# Patient Record
Sex: Male | Born: 1935 | Race: White | Hispanic: No
Health system: Midwestern US, Academic
[De-identification: ages and names within clinical notes are randomized; demographics above are authoritative.]

---

## 2016-09-21 ENCOUNTER — Ambulatory Visit: Admit: 2016-09-21 | Discharge: 2016-09-22 | Payer: MEDICARE

## 2016-09-22 DIAGNOSIS — Z950 Presence of cardiac pacemaker: ICD-10-CM

## 2016-09-22 DIAGNOSIS — I495 Sick sinus syndrome: Principal | ICD-10-CM

## 2016-10-17 ENCOUNTER — Encounter: Admit: 2016-10-17 | Discharge: 2016-10-17 | Payer: MEDICARE

## 2016-12-13 LAB — CBC
Lab: 4 — ABNORMAL LOW (ref 4.70–6.10)
Lab: 9.7

## 2016-12-13 LAB — COMPREHENSIVE METABOLIC PANEL
Lab: 0.2
Lab: 0.7
Lab: 104
Lab: 112 — ABNORMAL HIGH (ref 83–110)
Lab: 139 — ABNORMAL LOW (ref 14.0–18.0)
Lab: 16 — ABNORMAL LOW (ref 33.0–37.0)
Lab: 173 — ABNORMAL HIGH (ref 40–150)
Lab: 2.9 — ABNORMAL LOW (ref 3.4–4.8)
Lab: 25 — ABNORMAL HIGH (ref 27.0–31.0)
Lab: 4.3 — ABNORMAL LOW (ref 42.0–52.0)
Lab: 5.8 — ABNORMAL LOW (ref 6.2–8.1)
Lab: 8.6 — ABNORMAL LOW (ref 8.8–10.0)

## 2016-12-22 ENCOUNTER — Ambulatory Visit: Admit: 2016-12-21 | Discharge: 2016-12-22 | Payer: MEDICARE

## 2016-12-22 DIAGNOSIS — Z950 Presence of cardiac pacemaker: ICD-10-CM

## 2016-12-22 DIAGNOSIS — I495 Sick sinus syndrome: Principal | ICD-10-CM

## 2017-01-08 ENCOUNTER — Encounter: Admit: 2017-01-08 | Discharge: 2017-01-08 | Payer: MEDICARE

## 2017-01-22 ENCOUNTER — Ambulatory Visit: Admit: 2017-01-22 | Discharge: 2017-01-23 | Payer: MEDICARE

## 2017-01-22 DIAGNOSIS — Z950 Presence of cardiac pacemaker: ICD-10-CM

## 2017-01-22 DIAGNOSIS — I480 Paroxysmal atrial fibrillation: Principal | ICD-10-CM

## 2017-01-22 DIAGNOSIS — I25118 Atherosclerotic heart disease of native coronary artery with other forms of angina pectoris: ICD-10-CM

## 2017-01-22 DIAGNOSIS — I255 Ischemic cardiomyopathy: Principal | ICD-10-CM

## 2017-01-22 DIAGNOSIS — I10 Essential (primary) hypertension: ICD-10-CM

## 2017-01-22 NOTE — Progress Notes
Date of Service: 01/22/2017    Billy Calderon is a 83 y.o. male.       HPI     Herb was in the [place] office today with his son.  He is living in a skilled nursing facility now and seems to be getting along pretty well.  He is pretty much confined to a wheelchair now he needs help with transfers.  He has had no falls.  He denies any problems with exertional chest discomfort or lightheadedness.  He has breathlessness with pretty much any minor exertion.         Vitals:    01/22/17 1340   BP: 142/82   Pulse: 77   Weight: 78.5 kg (173 lb)   Height: 1.702 m (5' 7)     Body mass index is 27.1 kg/m???.     Past Medical History  Patient Active Problem List    Diagnosis Date Noted   ??? PAF (paroxysmal atrial fibrillation) (HCC) 05/25/2015   ??? Shortness of breath on exertion 02/22/2009   ??? Chest pain at rest 02/22/2009     Left chest pain lasted 45 mins, 1 episode back in Oct     ??? Pacemaker 02/22/2009   ??? Coronary artery disease 12/14/2008      (primary symptom was left shoulder/arm discomfort)     10/00 - Inferior MI treated with t-PA at [facility] and transferred to [facility];                Emergent PTCA/stent to RCA.     10/00 - CABG x 5 done at [facility] by Dr. Nancee Liter, SVG-RCA,                SVG-Diag, SSVG-first Diag and OMB.     12/00 - Exercise echo:5 min 30 sec, 80% of targeted HR, EF 50-55%;Negative for                ischemia with evidence of an inferobasal MI.      2/06 - Stress MPI: EF 52%, no active inducible ischemia.      03/2011 - Dobutamine echocardiogram [facility], Dr. Gypsy Decant):  Fixed apical hypokinesis.  No EF reported.     ??? Vertebral artery stenosis 12/14/2008     4/96 - Carotid doppler:severe arteriosclerotic stenosis of left vertebral artery; normal                appearance of common carotid artery and bifurcation bilaterally.     ??? Hyperlipidemia 12/14/2008     3/01 - Total 214, trig 113, HDL 61, LDL 130-No meds;Baycol 0.4mg  initiated. 5/01 - Total 149, trig 125, HDL 45, LDL 79, AST 21, ALT 37-Baycol 0.4mg .        8/01 - Baycol recalled;Zocor 20mg  initiated.        10/01 - Zocor d/c'd due to aching in legs.        11/01 - Total 201, trig 114, HDL 45, LDL 133, AST 21, ALT 39-No meds.        12/01 - Pravachol 20mg  initiated.        1/02 - Total 177, trig 102, HDL 48, LDL 109, AST 20, ALT 39-Pravachol 20mg .        10/02 - Pravachol DC'ed due to myalgias and heartburn.     ??? Seizure disorder (HCC) 12/14/2008   ??? Arthritis/Arthralgia 12/14/2008   ??? History of Gastritis with GI Bleed  12/14/2008   ??? Dizziness 12/14/2008   ??? Bradycardia 07/11/2007   ???  Hypertension 07/11/2007   ??? Ischemic cardiomyopathy 07/11/2007     10/00 - EF 40%.     12/00 - EF 55% per echo.     ??? 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 Acute MI inferior wall 01/03/1999     S/P PTCA, STENT RCA           Review of Systems   Constitution: Positive for weakness and malaise/fatigue.   HENT: Positive for hearing loss.    Eyes: Positive for blurred vision and vision loss in left eye.   Cardiovascular: Negative.    Respiratory: Positive for snoring.    Endocrine: Positive for cold intolerance and polyuria.   Hematologic/Lymphatic: Negative.    Skin: Negative.    Musculoskeletal: Positive for arthritis, back pain and stiffness.   Gastrointestinal: Positive for bowel incontinence, constipation and diarrhea.   Genitourinary: Positive for bladder incontinence and decreased libido.   Neurological: Positive for excessive daytime sleepiness, dizziness and focal weakness.   Psychiatric/Behavioral: Positive for depression.   Allergic/Immunologic: Negative.        Physical Exam    Physical Exam   General Appearance: no distress   Skin: warm, no ulcers or xanthomas   Digits and Nails: no cyanosis or clubbing   Eyes: conjunctivae and lids normal, pupils are equal and round   Teeth/Gums/Palate: dentition unremarkable, no lesions Lips & Oral Mucosa: no pallor or cyanosis   Neck Veins: normal JVP , neck veins are not distended   Thyroid: no nodules, masses, tenderness or enlargement   Chest Inspection: chest is normal in appearance   Respiratory Effort: breathing comfortably, no respiratory distress   Auscultation/Percussion: lungs clear to auscultation, no rales or rhonchi, no wheezing   PMI: PMI not enlarged or displaced   Cardiac Rhythm: regular rhythm and normal rate   Cardiac Auscultation: S1, S2 normal, no rub, no gallop   Murmurs: no murmur   Peripheral Circulation: normal peripheral circulation   Carotid Arteries: normal carotid upstroke bilaterally, no bruits   Radial Arteries: normal symmetric radial pulses   Abdominal Aorta: no abdominal aortic bruit   Pedal Pulses: normal symmetric pedal pulses   Lower Extremity Edema: no lower extremity edema   Abdominal Exam: soft, non-tender, no masses, bowel sounds normal   Liver & Spleen: no organomegaly   Gait & Station: in a wheelchair  Muscle Strength: normal muscle tone   Orientation: oriented to time, place and person   Affect & Mood: appropriate and sustained affect   Language and Memory: patient responsive and seems to comprehend information   Neurologic Exam: neurological assessment grossly intact   Other: moves all extremities      Problems Addressed Today  Encounter Diagnoses   Name Primary?   ??? Ischemic cardiomyopathy Yes   ??? PAF (paroxysmal atrial fibrillation) (HCC)    ??? Pacemaker    ??? Coronary artery disease of native artery of native heart with stable angina pectoris (HCC)    ??? Essential hypertension        Assessment and Plan       PAF (paroxysmal atrial fibrillation) (HCC)  No recurrent atrial arrhythmias on recent device check.    Coronary artery disease  No angina or other symptoms to suggest progression of coronary disease.    Hypertension  Blood pressure seems fine on the current medical program.    Pacemaker  Normal pacing system function today. Current Medications (  including today's revisions)  ??? acetaminophen (TYLENOL) 325 mg tablet Take 325 mg by mouth every 4-6 hours as needed for Pain.   ??? aspirin 325 mg tablet Take 325 mg by mouth daily. Take with food.   ??? EX-LAX (SENNOSIDES) PO Take  by mouth as Needed.   ??? IBUPROFEN (ADVIL PO) Take 200 mg by mouth As Needed.   ??? magnesium citrate oral solution Take 296 mL by mouth as Needed.   ??? Menthol-Zinc Oxide (CALMOSEPTINE) 0.44-20.6 % oint Apply  topically to affected area as Needed.   ??? Multivitamins with Iron (TAB-A-VITE/IRON) tab Take 1 Tab by mouth daily.   ??? nitroglycerin (NITROSTAT) 0.4 mg tablet 1 Tab every 5 minutes as needed for Chest Pain.   ??? nystatin (MYCOSTATIN) 100,000 unit/g topical cream Apply  topically to affected area three times daily.   ??? OMEGA-3 FATTY ACIDS (OMEGA 3 PO) Take 1 Tab by mouth Twice Daily.   ??? omeprazole DR(+) (PRILOSEC) 20 mg capsule Take 1 Cap by mouth daily. (Patient taking differently: Take 20 mg by mouth every 48 hours.)   ??? phenytoin SR (DILANTIN) 100 mg capsule Take 100 mg by mouth twice daily.   ??? polyethylene glycol 3350 (GLYCOLAX; MIRALAX) 17 gram/dose powder Take 17 g by mouth daily.   ??? SENNOSIDES/DOCUSATE SODIUM (SENNA PLUS PO) Take  by mouth twice daily.   ??? sertraline (ZOLOFT) 25 mg tablet Take 25 mg by mouth daily.   ??? simvastatin (ZOCOR) 10 mg tablet Take 1 tablet by mouth at bedtime daily.

## 2017-01-25 ENCOUNTER — Encounter: Admit: 2017-01-25 | Discharge: 2017-01-25 | Payer: MEDICARE

## 2017-02-21 NOTE — Assessment & Plan Note
No recurrent atrial arrhythmias on recent device check.

## 2017-02-21 NOTE — Assessment & Plan Note
Blood pressure seems fine on the current medical program.

## 2017-02-21 NOTE — Assessment & Plan Note
No angina or other symptoms to suggest progression of coronary disease.

## 2017-02-21 NOTE — Assessment & Plan Note
Normal pacing system function today.

## 2017-02-26 ENCOUNTER — Encounter: Admit: 2017-02-26 | Discharge: 2017-02-26 | Payer: MEDICARE

## 2017-02-26 ENCOUNTER — Ambulatory Visit: Admit: 2017-02-26 | Discharge: 2017-02-27 | Payer: MEDICARE

## 2017-02-26 DIAGNOSIS — R42 Dizziness and giddiness: Principal | ICD-10-CM

## 2017-02-28 LAB — LIPID PROFILE
Lab: 105 — ABNORMAL HIGH (ref <100)
Lab: 176
Lab: 22
Lab: 4
Lab: 46

## 2017-02-28 LAB — MAGNESIUM: Lab: 2.4 — ABNORMAL LOW (ref 98–107)

## 2017-03-22 ENCOUNTER — Ambulatory Visit: Admit: 2017-03-22 | Discharge: 2017-03-23 | Payer: MEDICARE

## 2017-03-22 DIAGNOSIS — I495 Sick sinus syndrome: Principal | ICD-10-CM

## 2017-03-23 DIAGNOSIS — Z950 Presence of cardiac pacemaker: ICD-10-CM

## 2017-03-25 ENCOUNTER — Encounter: Admit: 2017-03-25 | Discharge: 2017-03-25 | Payer: MEDICARE

## 2017-05-26 LAB — CBC: Lab: 9.7

## 2017-05-26 LAB — COMPREHENSIVE METABOLIC PANEL
Lab: 0.3
Lab: 134 — ABNORMAL LOW (ref 136–145)
Lab: 14
Lab: 16 — ABNORMAL HIGH (ref 0–14)
Lab: 21
Lab: 216 — ABNORMAL HIGH (ref 40–150)
Lab: 3.4
Lab: 7.7
Lab: 93

## 2017-06-21 ENCOUNTER — Encounter: Admit: 2017-06-21 | Discharge: 2017-06-21 | Payer: MEDICARE

## 2017-06-21 ENCOUNTER — Ambulatory Visit: Admit: 2017-06-21 | Discharge: 2017-06-22 | Payer: MEDICARE

## 2017-06-22 DIAGNOSIS — I495 Sick sinus syndrome: Principal | ICD-10-CM

## 2017-06-22 DIAGNOSIS — Z950 Presence of cardiac pacemaker: ICD-10-CM

## 2017-08-20 ENCOUNTER — Encounter: Admit: 2017-08-20 | Discharge: 2017-08-20 | Payer: MEDICARE

## 2017-08-27 ENCOUNTER — Ambulatory Visit: Admit: 2017-08-27 | Discharge: 2017-08-28 | Payer: MEDICARE

## 2017-08-27 ENCOUNTER — Encounter: Admit: 2017-08-27 | Discharge: 2017-08-27 | Payer: MEDICARE

## 2017-08-27 DIAGNOSIS — I480 Paroxysmal atrial fibrillation: ICD-10-CM

## 2017-08-27 DIAGNOSIS — E785 Hyperlipidemia, unspecified: ICD-10-CM

## 2017-08-27 DIAGNOSIS — I2510 Atherosclerotic heart disease of native coronary artery without angina pectoris: ICD-10-CM

## 2017-08-27 DIAGNOSIS — G40909 Epilepsy, unspecified, not intractable, without status epilepticus: ICD-10-CM

## 2017-08-27 DIAGNOSIS — Z950 Presence of cardiac pacemaker: Principal | ICD-10-CM

## 2017-08-27 DIAGNOSIS — K2970 Gastritis, unspecified, without bleeding: ICD-10-CM

## 2017-08-27 DIAGNOSIS — R079 Chest pain, unspecified: ICD-10-CM

## 2017-08-27 DIAGNOSIS — I25118 Atherosclerotic heart disease of native coronary artery with other forms of angina pectoris: ICD-10-CM

## 2017-08-27 DIAGNOSIS — M1990 Unspecified osteoarthritis, unspecified site: ICD-10-CM

## 2017-08-27 DIAGNOSIS — R0602 Shortness of breath: ICD-10-CM

## 2017-08-27 DIAGNOSIS — I10 Essential (primary) hypertension: ICD-10-CM

## 2017-08-27 DIAGNOSIS — I6509 Occlusion and stenosis of unspecified vertebral artery: ICD-10-CM

## 2017-08-29 ENCOUNTER — Encounter: Admit: 2017-08-29 | Discharge: 2017-08-29 | Payer: MEDICARE

## 2017-09-24 ENCOUNTER — Encounter: Admit: 2017-09-24 | Discharge: 2017-09-24 | Payer: MEDICARE

## 2017-09-30 ENCOUNTER — Ambulatory Visit: Admit: 2017-09-30 | Discharge: 2017-10-01 | Payer: MEDICARE

## 2017-09-30 DIAGNOSIS — Z950 Presence of cardiac pacemaker: Principal | ICD-10-CM

## 2017-10-04 LAB — COMPREHENSIVE METABOLIC PANEL
Lab: 0.4
Lab: 0.8
Lab: 104
Lab: 13
Lab: 139
Lab: 14
Lab: 14
Lab: 145 — ABNORMAL HIGH (ref 83–110)
Lab: 17
Lab: 177 — ABNORMAL HIGH (ref 40–150)
Lab: 26
Lab: 3.3 — ABNORMAL LOW (ref 3.4–4.8)
Lab: 3.9
Lab: 86
Lab: 9.1

## 2017-10-08 ENCOUNTER — Encounter: Admit: 2017-10-08 | Discharge: 2017-10-08 | Payer: MEDICARE

## 2017-11-19 ENCOUNTER — Ambulatory Visit: Admit: 2017-11-19 | Discharge: 2017-11-20 | Payer: MEDICARE

## 2017-11-19 DIAGNOSIS — I480 Paroxysmal atrial fibrillation: ICD-10-CM

## 2017-11-19 DIAGNOSIS — Z950 Presence of cardiac pacemaker: ICD-10-CM

## 2017-11-19 DIAGNOSIS — I255 Ischemic cardiomyopathy: Principal | ICD-10-CM

## 2017-11-28 LAB — BASIC METABOLIC PANEL
Lab: 0.7
Lab: 104
Lab: 104 — ABNORMAL LOW (ref 42.0–52.0)
Lab: 109
Lab: 139 — ABNORMAL LOW (ref 4.70–6.10)
Lab: 15 — ABNORMAL HIGH (ref 0–14)
Lab: 24 — ABNORMAL HIGH (ref 80.0–99.0)
Lab: 4.1 — ABNORMAL LOW (ref 14.0–18.0)
Lab: 8.5 — ABNORMAL LOW (ref 8.8–10.0)

## 2017-11-28 LAB — CBC: Lab: 7.2

## 2017-12-30 ENCOUNTER — Encounter: Admit: 2017-12-30 | Discharge: 2017-12-30 | Payer: MEDICARE

## 2017-12-30 ENCOUNTER — Ambulatory Visit: Admit: 2017-12-30 | Discharge: 2017-12-31 | Payer: MEDICARE

## 2017-12-31 DIAGNOSIS — Z950 Presence of cardiac pacemaker: Principal | ICD-10-CM

## 2018-01-09 ENCOUNTER — Encounter: Admit: 2018-01-09 | Discharge: 2018-01-09 | Payer: MEDICARE

## 2018-01-28 ENCOUNTER — Encounter: Admit: 2018-01-28 | Discharge: 2018-01-28 | Payer: MEDICARE

## 2018-02-13 ENCOUNTER — Ambulatory Visit: Admit: 2018-02-13 | Discharge: 2018-02-14 | Payer: MEDICARE

## 2018-02-13 ENCOUNTER — Encounter: Admit: 2018-02-13 | Discharge: 2018-02-13 | Payer: MEDICARE

## 2018-02-13 DIAGNOSIS — Z950 Presence of cardiac pacemaker: Principal | ICD-10-CM

## 2018-02-13 DIAGNOSIS — E785 Hyperlipidemia, unspecified: ICD-10-CM

## 2018-02-13 DIAGNOSIS — I480 Paroxysmal atrial fibrillation: ICD-10-CM

## 2018-02-13 DIAGNOSIS — I2510 Atherosclerotic heart disease of native coronary artery without angina pectoris: ICD-10-CM

## 2018-02-13 DIAGNOSIS — M1990 Unspecified osteoarthritis, unspecified site: ICD-10-CM

## 2018-02-13 DIAGNOSIS — R0602 Shortness of breath: ICD-10-CM

## 2018-02-13 DIAGNOSIS — Z951 Presence of aortocoronary bypass graft: ICD-10-CM

## 2018-02-13 DIAGNOSIS — G40909 Epilepsy, unspecified, not intractable, without status epilepticus: ICD-10-CM

## 2018-02-13 DIAGNOSIS — I10 Essential (primary) hypertension: ICD-10-CM

## 2018-02-13 DIAGNOSIS — R079 Chest pain, unspecified: ICD-10-CM

## 2018-02-13 DIAGNOSIS — K2970 Gastritis, unspecified, without bleeding: ICD-10-CM

## 2018-02-13 DIAGNOSIS — I6509 Occlusion and stenosis of unspecified vertebral artery: ICD-10-CM

## 2018-04-01 ENCOUNTER — Ambulatory Visit: Admit: 2018-04-01 | Discharge: 2018-04-02 | Payer: MEDICARE

## 2018-04-01 ENCOUNTER — Encounter: Admit: 2018-04-01 | Discharge: 2018-04-01 | Payer: MEDICARE

## 2018-04-02 DIAGNOSIS — Z950 Presence of cardiac pacemaker: Secondary | ICD-10-CM

## 2018-05-19 ENCOUNTER — Encounter: Admit: 2018-05-19 | Discharge: 2018-05-19 | Payer: MEDICARE

## 2018-05-19 MED ORDER — NITROSTAT 0.4 MG SL SUBL
ORAL_TABLET | SUBLINGUAL | 1 refills | 9.00000 days | Status: AC
Start: 2018-05-19 — End: ?

## 2018-05-19 NOTE — Telephone Encounter
Left message requesting callback to discuss use of NTG SL tablets.

## 2018-06-30 ENCOUNTER — Encounter: Admit: 2018-06-30 | Discharge: 2018-06-30 | Payer: MEDICARE

## 2018-06-30 ENCOUNTER — Ambulatory Visit: Admit: 2018-06-30 | Discharge: 2018-06-30 | Payer: MEDICARE

## 2018-06-30 DIAGNOSIS — I495 Sick sinus syndrome: Principal | ICD-10-CM

## 2018-06-30 DIAGNOSIS — Z950 Presence of cardiac pacemaker: ICD-10-CM

## 2018-07-02 ENCOUNTER — Encounter: Admit: 2018-07-02 | Discharge: 2018-07-02 | Payer: MEDICARE

## 2018-08-21 ENCOUNTER — Encounter: Admit: 2018-08-21 | Discharge: 2018-08-21

## 2018-08-21 NOTE — Telephone Encounter
Billy Calderon for an update on patient.  Patient was admitted to Hospice on 6/2 for advanced heart failure.  He is currently living in an assisted living facility.  He is continuing to take his medications.  His last bp was 172/82, O2  95 % on 1.5-2.0 liters per NC.  He has a decreased appetite, weakness, short term memory loss and sleeps about 80% of the time.  Hospice is visiting again today and will plan to reassess his bp.   Will route update to [facility] for review.          Billy, Billy Parody, RN    pt son Billy Calderon called to cancel Billy Calderon's appointment today w/ SDO due to being very weak, recently hospitalized at [facility]. Pt is now under the care of [facility] hospital hospice. pt son would like [place] to call his Calderon and get a report and pass all info onto [facility].     [facility]   Billy Calderon) [phone]       Billy Calderon (son)       Please call Billy Calderon back if anything else is needed

## 2018-09-29 ENCOUNTER — Ambulatory Visit: Admit: 2018-09-29 | Discharge: 2018-09-29

## 2018-09-29 ENCOUNTER — Encounter: Admit: 2018-09-29 | Discharge: 2018-09-29

## 2018-09-29 DIAGNOSIS — Z950 Presence of cardiac pacemaker: Principal | ICD-10-CM

## 2019-01-06 ENCOUNTER — Encounter: Admit: 2019-01-06 | Discharge: 2019-01-06 | Payer: MEDICARE

## 2019-01-06 MED ORDER — DILANTIN 30 MG PO CAP
ORAL_CAPSULE | Freq: Two times a day (BID) | PRN refills
Start: 2019-01-06 — End: ?

## 2019-01-06 NOTE — Telephone Encounter
Patient is not of Dr. Jena Gauss

## 2019-01-13 ENCOUNTER — Encounter: Admit: 2019-01-13 | Discharge: 2019-01-13 | Payer: MEDICARE

## 2019-01-14 MED ORDER — DILANTIN 30 MG PO CAP
ORAL_CAPSULE | Freq: Two times a day (BID) | PRN refills
Start: 2019-01-14 — End: ?

## 2019-01-26 ENCOUNTER — Encounter: Admit: 2019-01-26 | Discharge: 2019-01-26 | Payer: MEDICARE

## 2019-01-26 DIAGNOSIS — I480 Paroxysmal atrial fibrillation: Secondary | ICD-10-CM

## 2019-01-26 DIAGNOSIS — I255 Ischemic cardiomyopathy: Secondary | ICD-10-CM

## 2019-01-26 DIAGNOSIS — Z950 Presence of cardiac pacemaker: Secondary | ICD-10-CM

## 2019-03-13 DEATH — deceased

## 2019-06-08 ENCOUNTER — Encounter: Admit: 2019-06-08 | Discharge: 2019-06-08 | Payer: MEDICARE

## 2019-06-16 ENCOUNTER — Encounter: Admit: 2019-06-16 | Discharge: 2019-06-16 | Payer: MEDICARE

## 2019-09-16 ENCOUNTER — Encounter: Admit: 2019-09-16 | Discharge: 2019-09-16 | Payer: MEDICARE

## 2019-09-16 NOTE — Telephone Encounter
Patient was scheduled for a [employer] on 03/30/19 that has not been received. Obit search shows the patient passed away on March 22, 2019. Deactivated his remote monitoring. DB.

## 2020-07-24 IMAGING — CT BRAIN WO(Adult)
3 of 4 series · 4 of 11 positions shown, 5 images · non-contrast
Comparison: none

[Series 2: brain ax 5.00 hr40 s3 · axial · 0.34mm/px · z∈[-578,-528]mm · 2 of 4 slices shown, 3 images]
[im 2/4  brain]
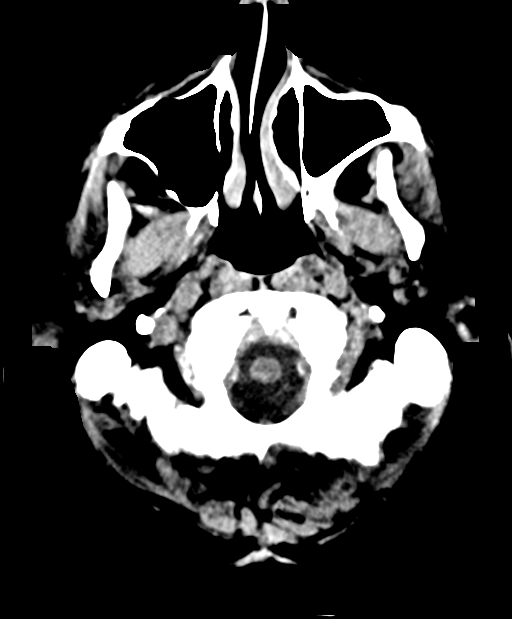
[im 2/4  bone]
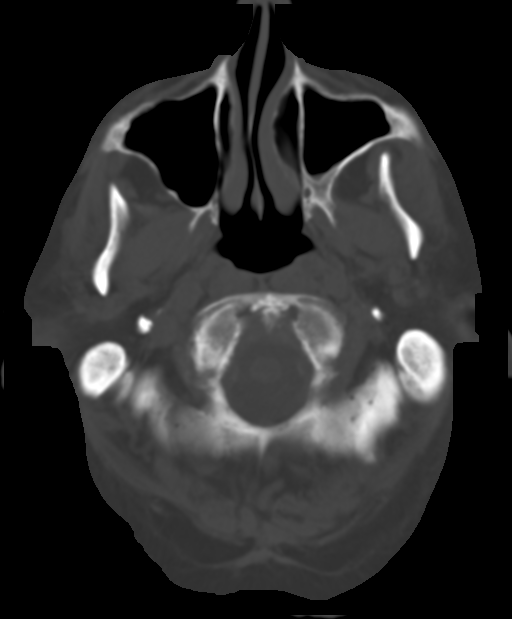
[im 3/4  brain]
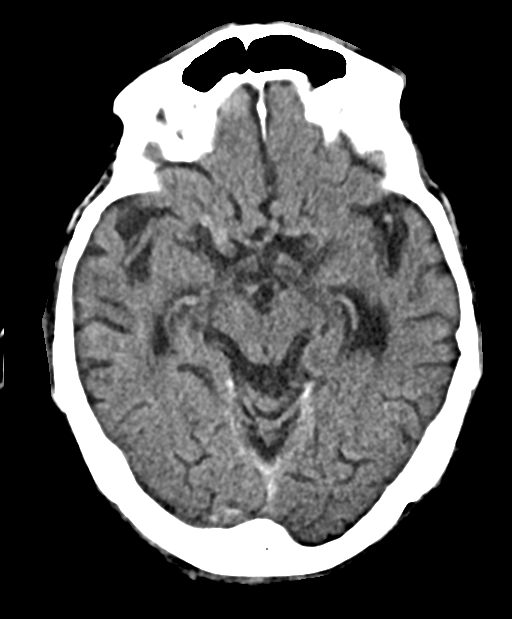

[Series 4: brain cor 5.00 hr40 s3 · coronal · 0.30mm/px · 1 of 3 slices shown]
[im 2/3  brain]
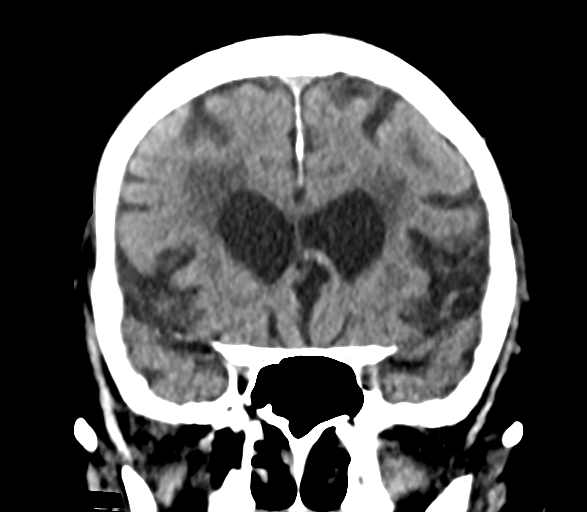

[Series 6: brain sag 5.00 hr40 s3 · sagittal · 0.30mm/px · 1 of 3 slices shown]
[im 2/3  brain]
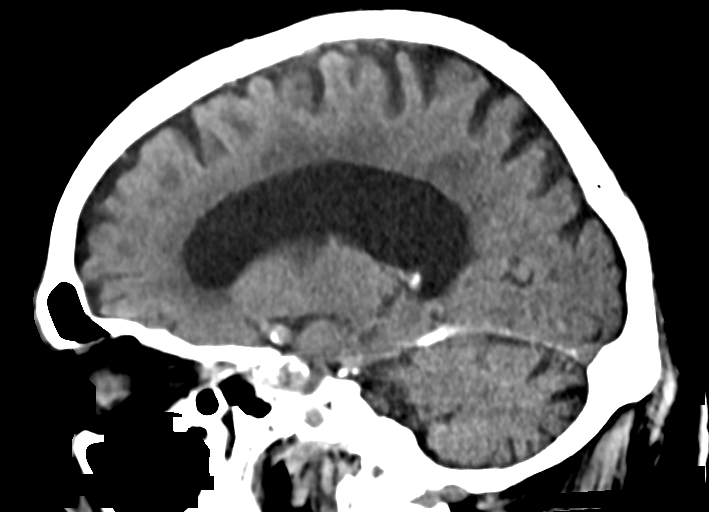

[4 of 11 positions shown; findings below may reference images not displayed]

**ADDENDUM**
ADDENDUM

Number of previous computed tomography exams in the last 12 months is 2.

Number of previous nuclear medicine myocardial perfusion studies in the last 12 months is 0.

TD/TT: /

EXAM

COMPUTED TOMOGRAPHY, HEAD OR BRAIN; WITHOUT CONTRAST MATERIAL CPT 71421

INDICATION

DIZZINESS, SYNCOPAL EPISODE, CONFUSION.

TECHNIQUE

Multiple contiguous transaxial images were obtained through the brain utilizing a multidetector CT
scanner.

All CT scans at this facility use dose modulation, iterative reconstruction, and/or weight based
dosing when appropriate to reduce radiation dose to as low as reasonably achievable.

COMPARISONS

Previous examination dated 05/26/2017.

FINDINGS

There is diffuse cortical atrophy. The ventricles and sulci are accordingly prominent. There is no
mass effect or shift in the midline structures. Lucency throughout the centrum ovale is consistent
with deep white matter ischemic changes and periventricular leukomalacia. The skull base overlying
and overlying calvarium are within normal limits.

IMPRESSION

Cortical atrophy and age related involutional changes. Periventricular leukomalacia. No acute
intracranial hemorrhage.

Tech Notes:

DIZZINESS, SYNCOPAL EPISODE, CONFUSION.

## 2020-07-30 IMAGING — CR CHEST
1 series · 1 of 1 positions shown · non-contrast
Comparison: none

[chest ap grid]
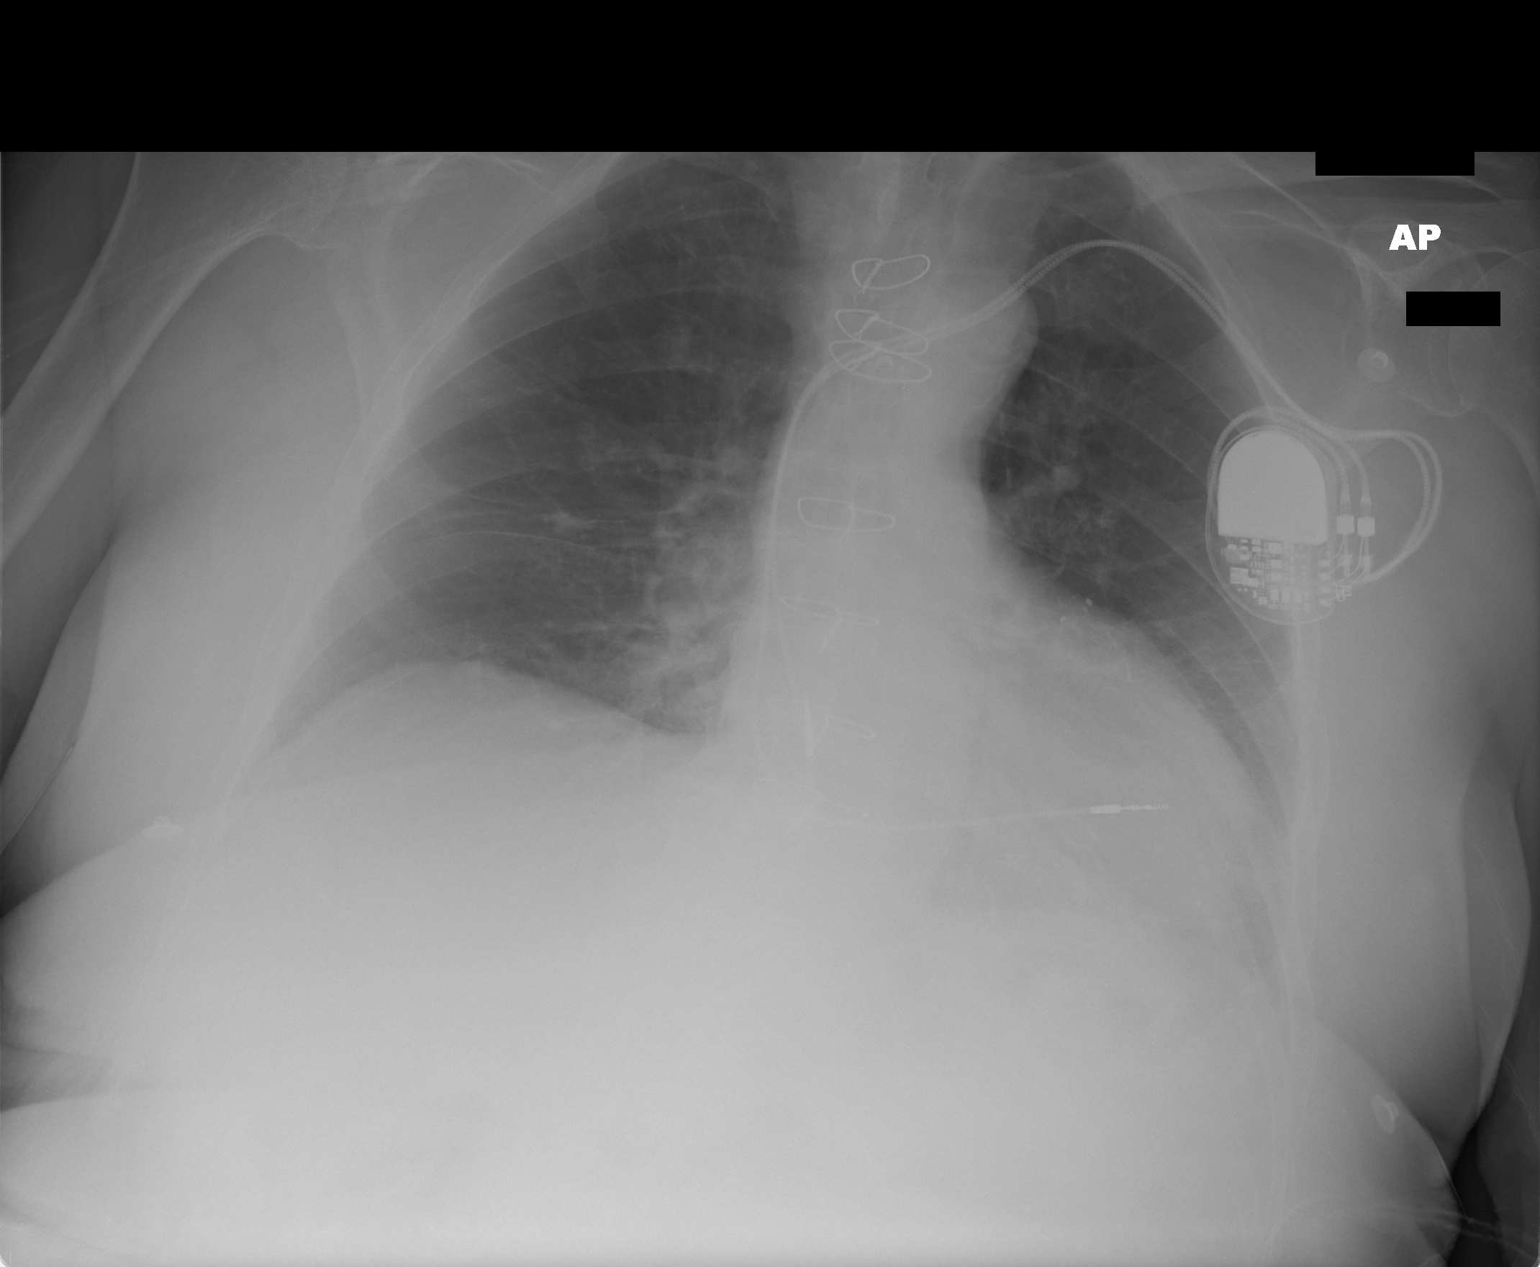

[1 of 1 positions shown; findings below may reference images not displayed]

EXAM
Chest x-ray.

INDICATION
shortness of breath
CONFUSION

FINDINGS
A single view of the chest was obtained. The prior study was reviewed from 05/26/2017.
There is mild cardiomegaly. There is interstitial opacity in the mid to lower portions of both
lungs. There has been partial clearing of a patchy area of alveolar opacity from the right lung base
since a prior study.
Pacemaker and transvenous electrodes are present.
There has been improvement of mild pulmonary vascular congestion.

IMPRESSION
There has been improvement of mild central pulmonary vascular congestion. There has been clearing
of a small area of alveolar opacity right lung base since the prior study.

Tech Notes:

CONFUSION

## 2020-10-04 IMAGING — CR CHEST
1 series · 1 of 1 positions shown · non-contrast
Comparison: none

[chest port x-wise]
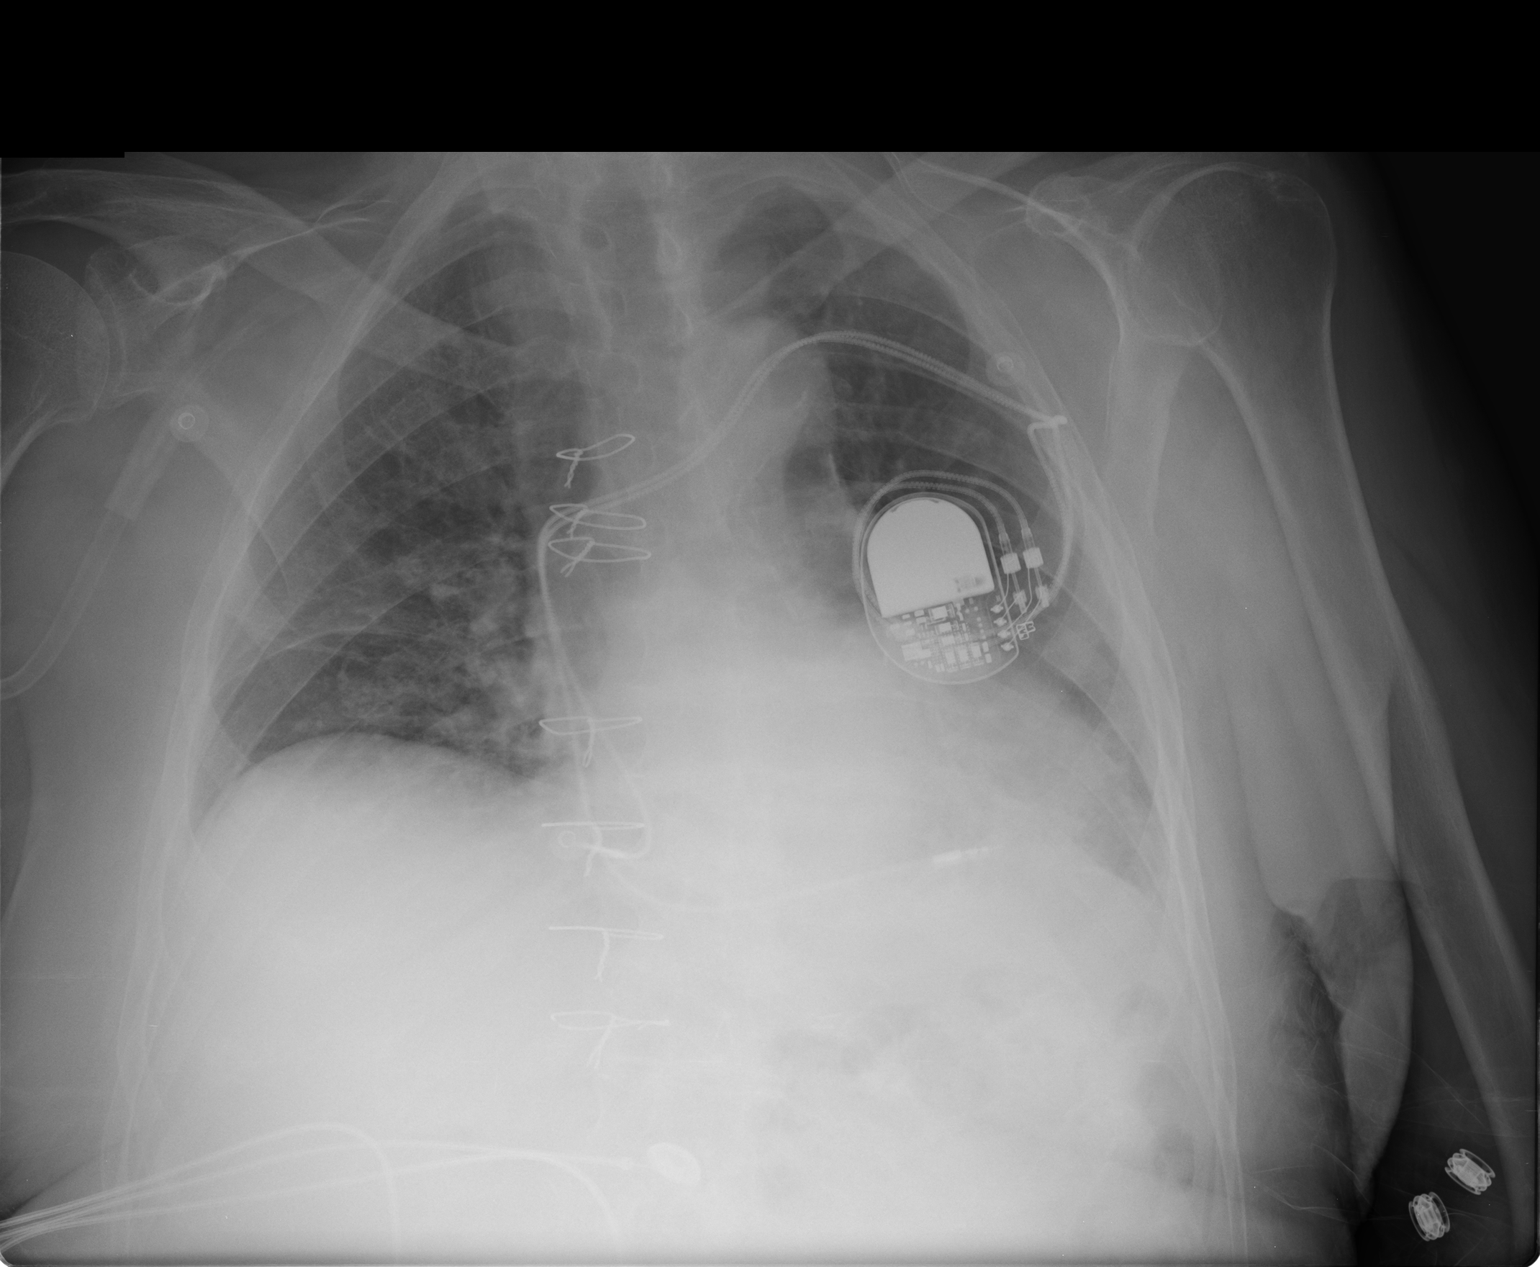

[1 of 1 positions shown; findings below may reference images not displayed]

DIAGNOSTIC STUDIES

EXAM
RADIOLOGICAL EXAMINATION, CHEST; SINGLE VIEW, FRONTAL CPT 65454

INDICATION
decreased LOC
FEVER, DECREASED LOC, DECREASED O2 SATS. KNOWN UTI, ON ABX. H/O A.FIB, CABG X5, CAD, CVA, HTN,
PACEMAKER.

TECHNIQUE
Single AP view chest

COMPARISONS
Chest radiograph earlier today

FINDINGS
Implanted device and postsurgical changes noted. Heart and pulmonary vasculature are unchanged.
There is decreased depth of inspiration and mild bibasilar opacities may represent atelectasis or
pneumonitis. No pleural effusion or pneumothorax.

IMPRESSION
Cardiomegaly and mild pulmonary venous congestion re- demonstrated. Mild bibasilar opacities may
represent atelectasis or pneumonitis.

Tech Notes:

FEVER, DECREASED LOC, DECREASED O2 SATS. KNOWN UTI, ON ABX. H/O A.FIB, CABG X5, CAD, CVA, HTN,
PACEMAKER.
